# Patient Record
Sex: Male | Born: 1985 | Hispanic: Yes | Marital: Single
Health system: Southern US, Community
[De-identification: ages and names within clinical notes are randomized; demographics above are authoritative.]

## PROBLEM LIST (undated history)

## (undated) DIAGNOSIS — Z889 Allergy status to unspecified drugs, medicaments and biological substances status: Secondary | ICD-10-CM

## (undated) DIAGNOSIS — M25569 Pain in unspecified knee: Secondary | ICD-10-CM

---

## 2016-11-16 ENCOUNTER — Emergency Department (HOSPITAL_COMMUNITY): Payer: Worker's Compensation

## 2016-11-16 ENCOUNTER — Encounter (HOSPITAL_COMMUNITY): Payer: Self-pay | Admitting: *Deleted

## 2016-11-16 ENCOUNTER — Emergency Department (HOSPITAL_COMMUNITY)
Admission: EM | Admit: 2016-11-16 | Discharge: 2016-11-16 | Disposition: A | Discharge status: Home / Self Care | Payer: Worker's Compensation | Attending: Emergency Medicine | Admitting: Emergency Medicine

## 2016-11-16 DIAGNOSIS — Z79899 Other long term (current) drug therapy: Secondary | ICD-10-CM | POA: Diagnosis not present

## 2016-11-16 DIAGNOSIS — Y929 Unspecified place or not applicable: Secondary | ICD-10-CM | POA: Diagnosis not present

## 2016-11-16 DIAGNOSIS — Y990 Civilian activity done for income or pay: Secondary | ICD-10-CM | POA: Diagnosis not present

## 2016-11-16 DIAGNOSIS — Y939 Activity, unspecified: Secondary | ICD-10-CM | POA: Diagnosis not present

## 2016-11-16 DIAGNOSIS — S59902A Unspecified injury of left elbow, initial encounter: Secondary | ICD-10-CM | POA: Diagnosis present

## 2016-11-16 DIAGNOSIS — S53105A Unspecified dislocation of left ulnohumeral joint, initial encounter: Secondary | ICD-10-CM | POA: Diagnosis not present

## 2016-11-16 DIAGNOSIS — W010XXA Fall on same level from slipping, tripping and stumbling without subsequent striking against object, initial encounter: Secondary | ICD-10-CM | POA: Diagnosis not present

## 2016-11-16 DIAGNOSIS — W19XXXA Unspecified fall, initial encounter: Secondary | ICD-10-CM

## 2016-11-16 HISTORY — DX: Allergy status to unspecified drugs, medicaments and biological substances: Z88.9

## 2016-11-16 HISTORY — DX: Pain in unspecified knee: M25.569

## 2016-11-16 MED ORDER — FENTANYL CITRATE (PF) 100 MCG/2ML IJ SOLN
50.0000 ug | Freq: Once | INTRAMUSCULAR | Status: AC
  Administered 2016-11-16: 50 ug via INTRAVENOUS
  Filled 2016-11-16: qty 2

## 2016-11-16 MED ORDER — ONDANSETRON HCL 4 MG/2ML IJ SOLN
4.0000 mg | Freq: Once | INTRAMUSCULAR | Status: AC
  Administered 2016-11-16: 4 mg via INTRAVENOUS
  Filled 2016-11-16: qty 2

## 2016-11-16 MED ORDER — PROPOFOL 10 MG/ML IV BOLUS
40.0000 mg | Freq: Once | INTRAVENOUS | Status: AC
  Administered 2016-11-16: 40 mg via INTRAVENOUS

## 2016-11-16 MED ORDER — OXYCODONE-ACETAMINOPHEN 5-325 MG PO TABS: 1.0000 | ORAL_TABLET | ORAL | 0 refills | Status: AC | PRN

## 2016-11-16 MED ORDER — IBUPROFEN 800 MG PO TABS: 800.0000 mg | ORAL_TABLET | Freq: Three times a day (TID) | ORAL | 0 refills | Status: AC

## 2016-11-16 MED ORDER — PROPOFOL 10 MG/ML IV BOLUS
INTRAVENOUS | Status: AC
  Administered 2016-11-16: 40 mg
  Filled 2016-11-16: qty 20

## 2016-11-16 MED ORDER — OXYCODONE-ACETAMINOPHEN 5-325 MG PO TABS
1.0000 | ORAL_TABLET | Freq: Once | ORAL | Status: AC
  Administered 2016-11-16: 1 via ORAL
  Filled 2016-11-16: qty 1

## 2016-11-16 NOTE — ED Notes (Signed)
Time out to confirm procedure-Conscious Sedation and location-reduction of left elbow.

## 2016-11-16 NOTE — ED Provider Notes (Signed)
[facility] Provider Note   Brad Durham: [id number] Arrival date & time: 11/16/16  1019     History   Chief Complaint Chief Complaint  Patient presents with  . Arm Injury    HPI Curt BearsVictor Durham is a 31 y.o. male.He presents to the emergency department with chief complaint of left elbow injury. The patient was at work today when he tripped over up tight and landed with his left arm outstretched. He had immediate severe pain in his left elbow and states that he feels it is dislocated. He has no previous injuries. He is right-hand dominant. He complains of numbness and tingling in the ulnar nerve distribution of the left arm. He also has some pain in the wrist.  HPI  Past Medical History:  Diagnosis Date  . H/O seasonal allergies   . Knee pain     There are no active problems to display for this patient.   History reviewed. No pertinent surgical history.     Home Medications    Prior to Admission medications   Medication Sig Start Date End Date Taking? Authorizing Provider  cetirizine (ZYRTEC) 10 MG tablet Take 10 mg by mouth daily.   Yes [provider]  naproxen sodium (ANAPROX) 220 MG tablet Take 220 mg by mouth every 8 (eight) hours as needed (for pain).   Yes [provider]  ibuprofen (ADVIL,MOTRIN) 800 MG tablet Take 1 tablet (800 mg total) by mouth 3 (three) times daily. 11/16/16   Arthor CaptainHarris, Sahalie Beth, PA-C  oxyCODONE-acetaminophen (PERCOCET) 5-325 MG tablet Take 1-2 tablets by mouth every 4 (four) hours as needed for severe pain. 11/16/16   Arthor CaptainHarris, Idalee Foxworthy, PA-C    Family History No family history on file.  Social History Social History  Substance Use Topics  . Smoking status: Not on file  . Smokeless tobacco: Not on file  . Alcohol use Not on file     Allergies   Patient has no known allergies.   Review of Systems Review of Systems Ten systems reviewed and are negative for acute change, except as noted in the HPI.    Physical  Exam Updated Vital Signs BP (!) 137/92 (BP Location: Right Arm)   Pulse 80   Temp 98.5 F (36.9 C) (Oral)   Resp 16   Wt 77.7 kg (171 lb 4.8 oz)   SpO2 100%   Physical Exam  Constitutional: He appears well-developed and well-nourished. No distress.  HENT:  Head: Normocephalic and atraumatic.  Eyes: Conjunctivae are normal. No scleral icterus.  Neck: Normal range of motion. Neck supple.  Cardiovascular: Normal rate, regular rhythm and normal heart sounds.   Pulmonary/Chest: Effort normal and breath sounds normal. No respiratory distress.  Abdominal: Soft. There is no tenderness.  Musculoskeletal: He exhibits no edema.  Left arm with a deformed elbow, posterior sulcus is seen just superior to the olecranon. No tenderness to palpation of the left forearm. Pulse is palpable and equal to the right radial pulse.  Neurological: He is alert.  Skin: Skin is warm and dry. He is not diaphoretic.  Psychiatric: His behavior is normal.  Nursing note and vitals reviewed.    ED Treatments / Results  Labs (all labs ordered are listed, but only abnormal results are displayed) Labs Reviewed - No data to display  EKG  EKG Interpretation None       Radiology Dg Elbow 2 Views Left  Result Date: 11/16/2016 CLINICAL DATA:  Post reduction EXAM: LEFT ELBOW - 2 VIEW COMPARISON:  Study  obtained earlier in the day FINDINGS: Frontal and lateral views were obtained. The previously noted elbow dislocation has been reduced successfully. There is soft tissue swelling with small joint effusion. The tiny avulsion noted on prereduction images is not seen currently. IMPRESSION: Successful reduction of dislocation. No fracture evident. There is soft tissue swelling with small joint effusion. Electronically Signed   By: Bretta Bang III M.D.   On: 11/16/2016 13:17   Dg Elbow Complete Left (3+view)  Result Date: 11/16/2016 CLINICAL DATA:  Fall EXAM: LEFT ELBOW - COMPLETE 3+ VIEW COMPARISON:  None.  FINDINGS: Elbow dislocation. Posterior dislocation is present with a tiny avulsion fragment in the joint. Radius and ulna also subluxed laterally. IMPRESSION: Elbow dislocation with tiny 1 mm avulsion fracture in the joint. Electronically Signed   By: Marlan Palau M.D.   On: 11/16/2016 11:15   Dg Wrist Complete Left  Result Date: 11/16/2016 CLINICAL DATA:  Pain following fall EXAM: LEFT WRIST - COMPLETE 3+ VIEW COMPARISON:  None. FINDINGS: Frontal, oblique, lateral, and ulnar deviation scaphoid images were obtained. No fracture or dislocation. Joint spaces are unremarkable. There is a benign bone island in the left scaphoid bone. No erosive change. IMPRESSION: No fracture or dislocation. No evident arthropathy. Bone island in the scaphoid bone. Electronically Signed   By: Bretta Bang III M.D.   On: 11/16/2016 13:14    Procedures Reduction of dislocation Date/Time: 11/16/2016 4:17 PM Performed by: Arthor Captain Authorized by: Arthor Captain  Consent: Written consent obtained. Risks and benefits: risks, benefits and alternatives were discussed Consent given by: patient Patient understanding: patient states understanding of the procedure being performed Patient identity confirmed: provided demographic data, verbally with patient and arm band Time out: Immediately prior to procedure a "time out" was called to verify the correct patient, procedure, equipment, support staff and site/side marked as required.  Sedation: Patient sedated: yes (see note by Cathren Laine) Patient tolerance: Patient tolerated the procedure well with no immediate complications    (including critical care time)  Medications Ordered in ED Medications  fentaNYL (SUBLIMAZE) injection 50 mcg (50 mcg Intravenous Given 11/16/16 1121)  ondansetron (ZOFRAN) injection 4 mg (4 mg Intravenous Given 11/16/16 1121)  propofol (DIPRIVAN) 10 mg/mL bolus/IV push 40 mg (40 mg Intravenous Given 11/16/16 1224)  propofol (DIPRIVAN)  10 mg/mL bolus/IV push (40 mg  Given 11/16/16 1227)  oxyCODONE-acetaminophen (PERCOCET/ROXICET) 5-325 MG per tablet 1 tablet (1 tablet Oral Given 11/16/16 1300)     Initial Impression / Assessment and Plan / ED Course  I have reviewed the triage vital signs and the nursing notes.  Pertinent labs & imaging results that were available during my care of the patient were reviewed by me and considered in my medical decision making (see chart for details).    Patient with dislocation of the left elbow. Negative x-ray of the wrist. Successfully reduced in the emergency department. Patient placed in a sling. He is neurovascularly intact after reduction. The patient given pain medicine and anti-inflammatories. Advised to ice and follow-up with hand specialist soon as possible.  Final Clinical Impressions(s) / ED Diagnoses   Final diagnoses:  Fall  Dislocation of left elbow, initial encounter    New Prescriptions Discharge Medication List as of 11/16/2016  2:15 PM    START taking these medications   Details  ibuprofen (ADVIL,MOTRIN) 800 MG tablet Take 1 tablet (800 mg total) by mouth 3 (three) times daily., Starting Mon 11/16/2016, Print    oxyCODONE-acetaminophen (PERCOCET) 5-325 MG tablet Take  1-2 tablets by mouth every 4 (four) hours as needed for severe pain., Starting Mon 11/16/2016, Print         Tiburcio Pea, Draper, PA-C 11/16/16 1619    Cathren Laine, MD 11/17/16 [phone]

## 2016-11-16 NOTE — ED Triage Notes (Signed)
Per EMS pt was working at a [employer], carrying things when he tripped over a pipe, landing on his left arm. Pt has obvious deformity to left elbow. Pulses intact. Pt was given 100 mcg of fentanyl en route, b/p137/54  hr70

## 2016-11-16 NOTE — ED Notes (Signed)
Bed: [id number] Expected date:  Expected time:  Means of arrival:  Comments: EMS-shoulder injury

## 2016-11-16 NOTE — Discharge Instructions (Signed)
General instructions Do not put pressure on any part of the splint until it is fully hardened. This may take several hours. Take over-the-counter and prescription medicines only as told by your health care provider. Do not use any tobacco products, including cigarettes, chewing tobacco, or e-cigarettes. Tobacco can delay bone healing. If you need help quitting, ask your health care provider. Keep all follow-up visits as told by your health care provider. This is important. Contact a health care provider if: Your pain gets worse. Your splint or sling gets damaged. Get help right away if: You lose feeling in your arm or hand. Your arm or hand becomes white and cold.

## 2016-11-16 NOTE — ED Notes (Signed)
Concious Sedation procedure complete.  Pt returned to baseline.  A&O x 4.  Aldrete score 10.

## 2016-11-16 NOTE — ED Notes (Signed)
Reduction begun and finished

## 2018-03-21 IMAGING — CR DG WRIST COMPLETE 3+V*L*
4 series · 4 of 4 positions shown · non-contrast
Comparison: None.

CLINICAL DATA: Pain following fall

EXAM:
LEFT WRIST - COMPLETE 3+ VIEW

[x wrist pa left]
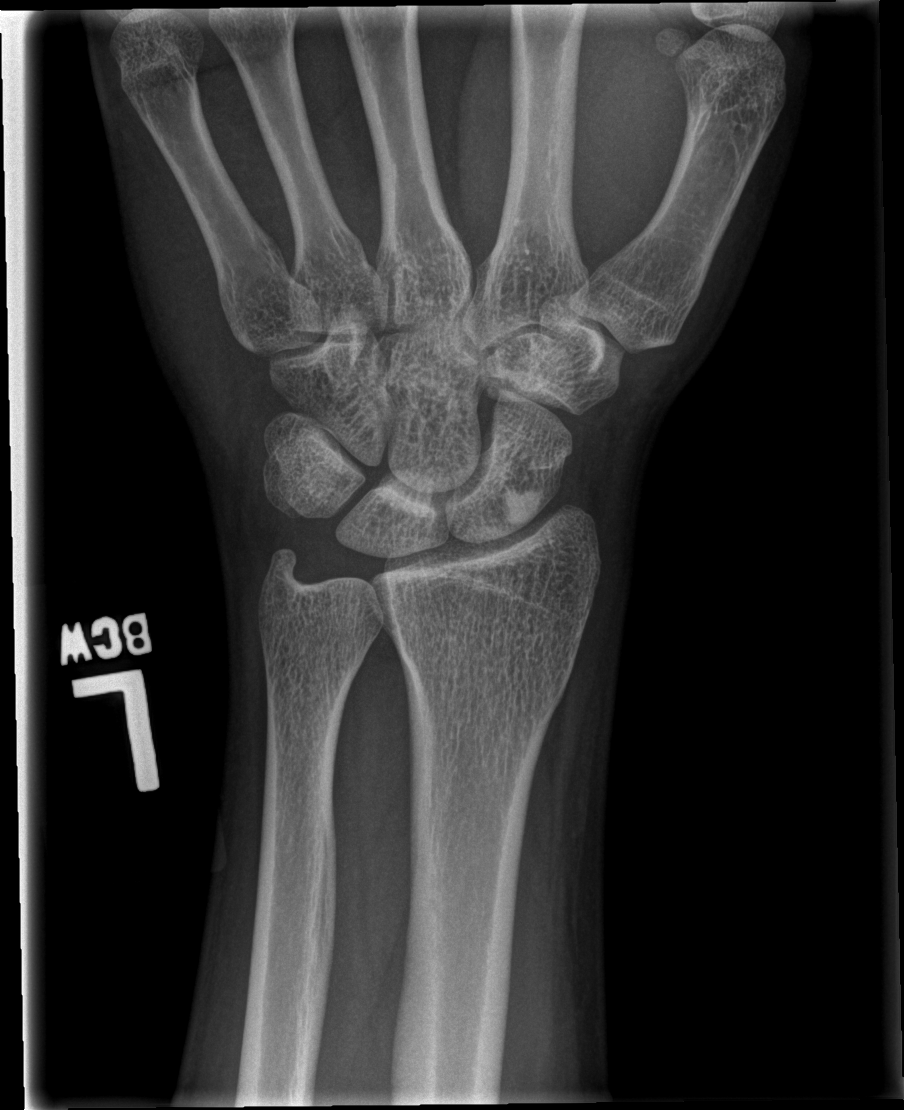

[x wrist obl left]
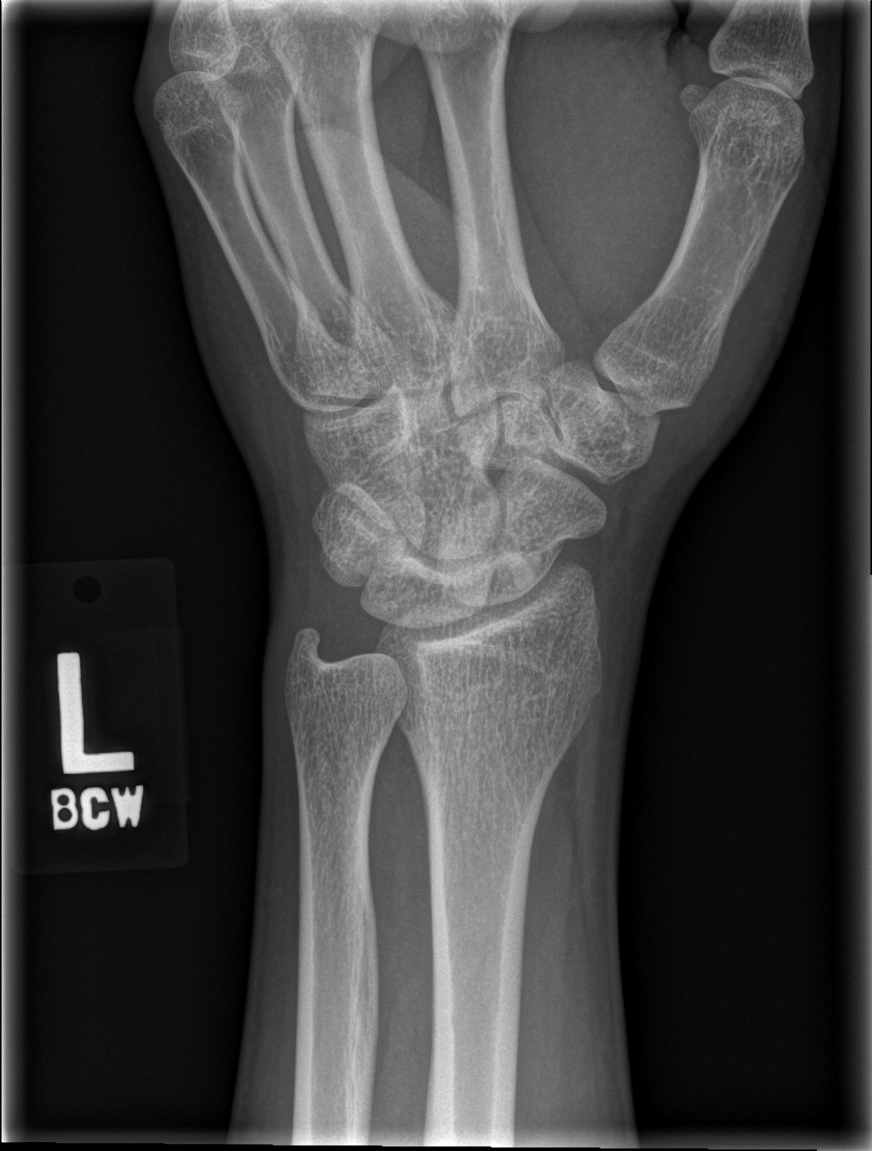

[x wrist lat left]
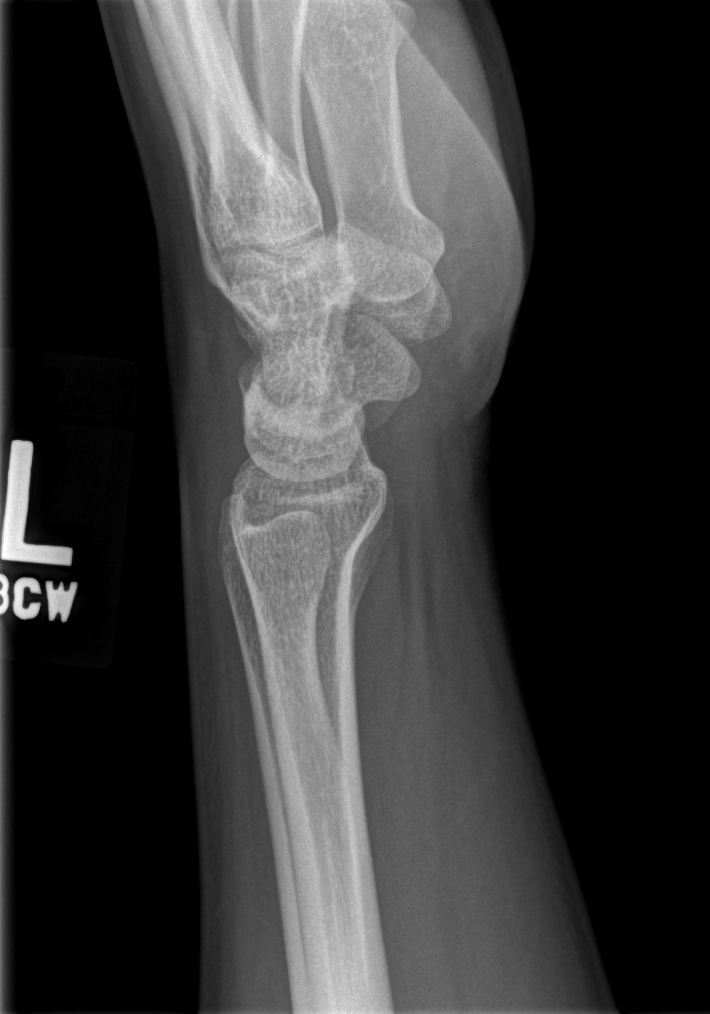

[x wrist navicular view left]
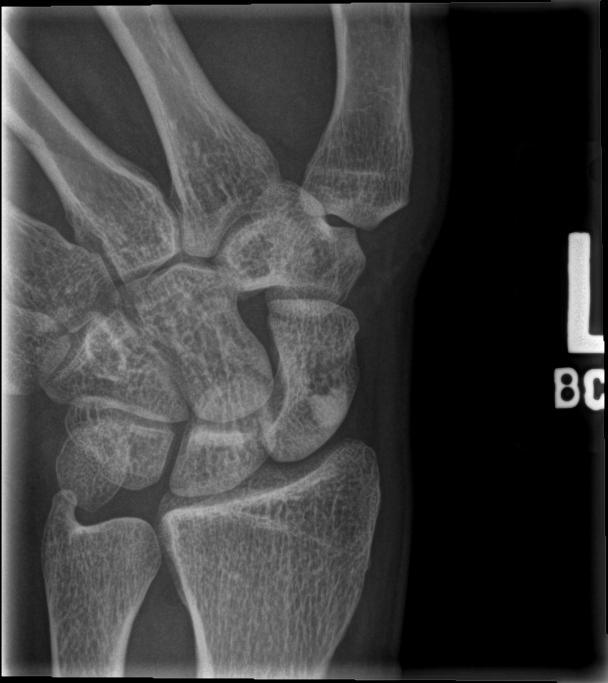

[4 of 4 positions shown; findings below may reference images not displayed]

FINDINGS: Frontal, oblique, lateral, and ulnar deviation scaphoid images were
obtained. No fracture or dislocation. Joint spaces are unremarkable.
There is a benign bone island in the left scaphoid bone. No erosive
change.
IMPRESSION: No fracture or dislocation. No evident arthropathy. Bone island in
the scaphoid bone.
